# Patient Record
Sex: Male | Born: 1970 | Hispanic: Yes | Marital: Married | Smoking: Never smoker
Health system: Southern US, Community
[De-identification: ages and names within clinical notes are randomized; demographics above are authoritative.]

## PROBLEM LIST (undated history)

## (undated) DIAGNOSIS — R0600 Dyspnea, unspecified: Secondary | ICD-10-CM

## (undated) DIAGNOSIS — I10 Essential (primary) hypertension: Secondary | ICD-10-CM

## (undated) DIAGNOSIS — R7303 Prediabetes: Secondary | ICD-10-CM

---

## 2009-04-21 ENCOUNTER — Emergency Department: Payer: Self-pay | Admitting: Emergency Medicine

## 2018-11-24 ENCOUNTER — Other Ambulatory Visit: Payer: Self-pay

## 2018-11-24 ENCOUNTER — Telehealth: Payer: Self-pay

## 2018-11-24 DIAGNOSIS — Z20822 Contact with and (suspected) exposure to covid-19: Secondary | ICD-10-CM

## 2018-11-24 NOTE — Telephone Encounter (Signed)
Dr. Rutherford Guys from [facility] called to request the patient for covid testing. I called the patient using [facility] ID# [id number] and advised of the request, he verbalized understanding. Appointment scheduled for today at 1245 at [facility], advised of location and to wear a mask for everyone in the vehicle, he verbalized understanding. Order placed.   [place] Fax: [phone]

## 2018-12-01 LAB — NOVEL CORONAVIRUS, NAA: SARS-CoV-2, NAA: DETECTED — AB

## 2019-12-31 ENCOUNTER — Emergency Department
Admission: EM | Admit: 2019-12-31 | Discharge: 2019-12-31 | Disposition: A | Discharge status: Home / Self Care | Payer: No Typology Code available for payment source | Attending: Emergency Medicine | Admitting: Emergency Medicine

## 2019-12-31 ENCOUNTER — Emergency Department: Payer: No Typology Code available for payment source

## 2019-12-31 ENCOUNTER — Other Ambulatory Visit: Payer: Self-pay

## 2019-12-31 DIAGNOSIS — M542 Cervicalgia: Secondary | ICD-10-CM | POA: Diagnosis not present

## 2019-12-31 DIAGNOSIS — Y9389 Activity, other specified: Secondary | ICD-10-CM | POA: Insufficient documentation

## 2019-12-31 DIAGNOSIS — Y999 Unspecified external cause status: Secondary | ICD-10-CM | POA: Diagnosis not present

## 2019-12-31 DIAGNOSIS — M545 Low back pain: Secondary | ICD-10-CM | POA: Insufficient documentation

## 2019-12-31 DIAGNOSIS — Y9289 Other specified places as the place of occurrence of the external cause: Secondary | ICD-10-CM | POA: Diagnosis not present

## 2019-12-31 DIAGNOSIS — M546 Pain in thoracic spine: Secondary | ICD-10-CM | POA: Diagnosis not present

## 2019-12-31 DIAGNOSIS — I10 Essential (primary) hypertension: Secondary | ICD-10-CM | POA: Insufficient documentation

## 2019-12-31 HISTORY — DX: Essential (primary) hypertension: I10

## 2019-12-31 MED ORDER — MELOXICAM 15 MG PO TABS: 15.0000 mg | ORAL_TABLET | Freq: Every day | ORAL | 1 refills | Status: AC

## 2019-12-31 MED ORDER — METHOCARBAMOL 500 MG PO TABS: 500.0000 mg | ORAL_TABLET | Freq: Three times a day (TID) | ORAL | 0 refills | Status: AC | PRN
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
## 2019-12-31 NOTE — ED Notes (Signed)
DC instructions discussed via live spanish interpreter.
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
## 2019-12-31 NOTE — Discharge Instructions (Signed)
Take meloxicam and Robaxin for pain and inflammation. °

## 2020-08-23 ENCOUNTER — Encounter: Payer: Self-pay | Admitting: Ophthalmology

## 2020-08-27 ENCOUNTER — Other Ambulatory Visit
Admission: RE | Admit: 2020-08-27 | Discharge: 2020-08-27 | Disposition: A | Discharge status: Home / Self Care | Payer: Self-pay | Source: Ambulatory Visit | Attending: Ophthalmology | Admitting: Ophthalmology

## 2020-08-27 ENCOUNTER — Other Ambulatory Visit: Payer: Self-pay

## 2020-08-27 DIAGNOSIS — Z20822 Contact with and (suspected) exposure to covid-19: Secondary | ICD-10-CM | POA: Insufficient documentation

## 2020-08-27 DIAGNOSIS — Z01812 Encounter for preprocedural laboratory examination: Secondary | ICD-10-CM | POA: Insufficient documentation

## 2020-08-28 LAB — SARS CORONAVIRUS 2 (TAT 6-24 HRS): SARS Coronavirus 2: NEGATIVE

## 2020-09-03 ENCOUNTER — Other Ambulatory Visit: Payer: Self-pay

## 2020-09-03 ENCOUNTER — Other Ambulatory Visit
Admission: RE | Admit: 2020-09-03 | Discharge: 2020-09-03 | Disposition: A | Discharge status: Home / Self Care | Payer: Self-pay | Source: Ambulatory Visit | Attending: Ophthalmology | Admitting: Ophthalmology

## 2020-09-03 DIAGNOSIS — Z20822 Contact with and (suspected) exposure to covid-19: Secondary | ICD-10-CM | POA: Insufficient documentation

## 2020-09-03 DIAGNOSIS — Z01812 Encounter for preprocedural laboratory examination: Secondary | ICD-10-CM | POA: Insufficient documentation

## 2020-09-03 LAB — SARS CORONAVIRUS 2 (TAT 6-24 HRS): SARS Coronavirus 2: NEGATIVE

## 2020-09-05 ENCOUNTER — Encounter
Admission: RE | Disposition: A | Discharge status: Home / Self Care | Payer: Self-pay | Source: Home / Self Care | Attending: Ophthalmology

## 2020-09-05 ENCOUNTER — Other Ambulatory Visit: Payer: Self-pay

## 2020-09-05 ENCOUNTER — Ambulatory Visit: Payer: Self-pay | Admitting: Anesthesiology

## 2020-09-05 ENCOUNTER — Encounter: Payer: Self-pay | Admitting: Ophthalmology

## 2020-09-05 ENCOUNTER — Ambulatory Visit
Admission: RE | Admit: 2020-09-05 | Discharge: 2020-09-05 | Disposition: A | Discharge status: Home / Self Care | Payer: Self-pay | Attending: Ophthalmology | Admitting: Ophthalmology

## 2020-09-05 DIAGNOSIS — Z79899 Other long term (current) drug therapy: Secondary | ICD-10-CM | POA: Insufficient documentation

## 2020-09-05 DIAGNOSIS — H26102 Unspecified traumatic cataract, left eye: Secondary | ICD-10-CM | POA: Insufficient documentation

## 2020-09-05 HISTORY — PX: CATARACT EXTRACTION W/PHACO: SHX586

## 2020-09-05 HISTORY — DX: Dyspnea, unspecified: R06.00

## 2020-09-05 HISTORY — DX: Prediabetes: R73.03

## 2020-09-05 SURGERY — PHACOEMULSIFICATION, CATARACT, WITH IOL INSERTION
Anesthesia: Monitor Anesthesia Care | Site: Eye | Laterality: Left

## 2020-09-05 MED ORDER — BRIMONIDINE TARTRATE-TIMOLOL 0.2-0.5 % OP SOLN
OPHTHALMIC | Status: DC | PRN
  Administered 2020-09-05: 1 [drp] via OPHTHALMIC

## 2020-09-05 MED ORDER — SODIUM HYALURONATE 23 MG/ML IO SOLN
INTRAOCULAR | Status: DC | PRN
  Administered 2020-09-05: 0.6 mL via INTRAOCULAR

## 2020-09-05 MED ORDER — ACETAMINOPHEN 325 MG PO TABS
325.0000 mg | ORAL_TABLET | ORAL | Status: DC | PRN
Start: 2020-09-05 — End: 2020-09-05

## 2020-09-05 MED ORDER — ARMC OPHTHALMIC DILATING DROPS
1.0000 "application " | OPHTHALMIC | Status: DC | PRN
  Administered 2020-09-05 (×3): 1 via OPHTHALMIC

## 2020-09-05 MED ORDER — ALFENTANIL 500 MCG/ML IJ INJ
INJECTION | INTRAVENOUS | Status: DC | PRN
  Administered 2020-09-05: 500 ug via INTRAVENOUS

## 2020-09-05 MED ORDER — MIDAZOLAM HCL 2 MG/2ML IJ SOLN
INTRAMUSCULAR | Status: DC | PRN
  Administered 2020-09-05: 1 mg via INTRAVENOUS

## 2020-09-05 MED ORDER — LIDOCAINE HCL (PF) 2 % IJ SOLN
INTRAOCULAR | Status: DC | PRN
  Administered 2020-09-05: .5 mL

## 2020-09-05 MED ORDER — LIDOCAINE HCL 2 % IJ SOLN
INTRAMUSCULAR | Status: DC | PRN
  Administered 2020-09-05: 3 mL via OPHTHALMIC

## 2020-09-05 MED ORDER — EPINEPHRINE PF 1 MG/ML IJ SOLN
INTRAOCULAR | Status: DC | PRN
  Administered 2020-09-05: 96 mL via OPHTHALMIC

## 2020-09-05 MED ORDER — TETRACAINE HCL 0.5 % OP SOLN
1.0000 [drp] | OPHTHALMIC | Status: DC | PRN
  Administered 2020-09-05 (×3): 1 [drp] via OPHTHALMIC

## 2020-09-05 MED ORDER — ACETAMINOPHEN 160 MG/5ML PO SOLN: 325.0000 mg | ORAL | Status: DC | PRN

## 2020-09-05 MED ORDER — NA HYALUR & NA CHOND-NA HYALUR 0.4-0.35 ML IO KIT
PACK | INTRAOCULAR | Status: DC | PRN
  Administered 2020-09-05: .5 mL via INTRAOCULAR

## 2020-09-05 MED ORDER — ONDANSETRON HCL 4 MG/2ML IJ SOLN: 4.0000 mg | Freq: Once | INTRAMUSCULAR | Status: DC | PRN

## 2020-09-05 MED ORDER — CEFUROXIME OPHTHALMIC INJECTION 1 MG/0.1 ML
INJECTION | OPHTHALMIC | Status: DC | PRN
  Administered 2020-09-05: 0.1 mL via INTRACAMERAL

## 2020-09-05 MED ORDER — TRYPAN BLUE 0.06 % OP SOLN
OPHTHALMIC | Status: DC | PRN
  Administered 2020-09-05: 0.5 mL via INTRAOCULAR

## 2020-09-05 SURGICAL SUPPLY — 23 items
CANNULA ANT/CHMB 27GA (MISCELLANEOUS) ×2 IMPLANT
GLOVE EXAM LX STRL 7.5 (GLOVE) ×2 IMPLANT
GLOVE SURG TRIUMPH 8.0 PF LTX (GLOVE) ×2 IMPLANT
GOWN STRL REUS W/ TWL LRG LVL3 (GOWN DISPOSABLE) ×2 IMPLANT
GOWN STRL REUS W/TWL LRG LVL3 (GOWN DISPOSABLE) ×4
LENS IOL DIOP 15.5 (Intraocular Lens) ×2 IMPLANT
LENS IOL TECNIS MONO 15.5 (Intraocular Lens) ×1 IMPLANT
MARKER SKIN DUAL TIP RULER LAB (MISCELLANEOUS) ×2 IMPLANT
NDL RETROBULBAR .5 NSTRL (NEEDLE) ×2 IMPLANT
NDL SAFETY ECLIPSE 18X1.5 (NEEDLE) ×1 IMPLANT
NEEDLE CAPSULORHEX 25GA (NEEDLE) ×2 IMPLANT
NEEDLE FILTER BLUNT 18X 1/2SAF (NEEDLE) ×2
NEEDLE FILTER BLUNT 18X1 1/2 (NEEDLE) ×2 IMPLANT
NEEDLE HYPO 18GX1.5 SHARP (NEEDLE) ×2
PACK CATARACT BRASINGTON (MISCELLANEOUS) ×2 IMPLANT
PACK EYE AFTER SURG (MISCELLANEOUS) ×2 IMPLANT
PACK OPTHALMIC (MISCELLANEOUS) ×2 IMPLANT
SOLUTION OPHTHALMIC SALT (MISCELLANEOUS) ×2 IMPLANT
SYR 3ML LL SCALE MARK (SYRINGE) ×4 IMPLANT
SYR 5ML LL (SYRINGE) ×2 IMPLANT
SYR TB 1ML LUER SLIP (SYRINGE) ×2 IMPLANT
WATER STERILE IRR 250ML POUR (IV SOLUTION) ×2 IMPLANT
WIPE NON LINTING 3.25X3.25 (MISCELLANEOUS) ×2 IMPLANT

## 2020-09-05 NOTE — Discharge Instructions (Signed)
Cirugía de cataratas, cuidados posteriores °Cataract Surgery, Care After °Esta hoja le brinda información sobre cómo cuidarse después del procedimiento. El médico también podrá darle indicaciones más específicas. Comuníquese con el médico si tiene problemas o preguntas. °¿Qué puedo esperar después del procedimiento? °Después del procedimiento, es común tener los siguientes síntomas: °· Picazón. °· Molestias. °· Secreción líquida. °· Sensibilidad a la luz y al tacto. °· Moretones en el ojo o a su alrededor. °· Leve visión borrosa. °Sigue estas indicaciones en tu casa: °Cuidado de los ojos °· No se toque ni se frote los ojos. °· Protéjase los ojos como se lo haya indicado el médico. Tal vez le indiquen que use una protección ocular resistente o gafas de sol. °· No use lentes de contacto en el ojo o los ojos afectados hasta que el médico lo autorice. °· Mantenga la zona que rodea el ojo limpia y seca: °? Evite nadar. °? No permita que el agua le caiga directamente en el rostro cuando se ducha. °? Evite que el jabón o el champú entren en sus ojos. °· Revísese el ojo todos los días para detectar signos de infección. Esté atento a lo siguiente: °? Enrojecimiento, hinchazón o dolor. °? Líquido, sangre o pus. °? Calor. °? Mal olor. °? Visión que empeora. °? Sensibilidad que empeora.   °Actividad °· No conduzca durante 24 horas si le administraron un sedante durante el procedimiento. °· Evite las actividades extenuantes, como practicar deportes de contacto, durante el tiempo que le haya indicado el médico. °· No conduzca ni opere maquinaria pesada hasta que el médico lo autorice. °· No se incline ni levante objetos pesados. Al agacharse, aumenta la presión en los ojos. Puede caminar, subir escaleras y realizar trabajos domésticos livianos. °· Pregunte a su médico cuándo podrá volver al trabajo. Si trabaja en un ambiente con polvo, podrán indicarle que use una protección ocular durante cierto tiempo. °Indicaciones  generales °· Tome o aplíquese los medicamentos de venta libre y los recetados solamente como se lo haya indicado el médico. Esto incluye las gotas oftálmicas. °· Concurra a todas las visitas de seguimiento como se lo haya indicado el médico. Esto es importante. °Comuníquese con un médico si: °· Observa un aumento del hematoma alrededor del ojo. °· Siente dolor que no se alivia con los medicamentos. °· Fiebre. °· Presenta enrojecimiento, hinchazón o dolor en el ojo. °· Observa líquido, sangre o pus que emanan de la incisión. °· La visión empeora. °· La sensibilidad a la luz empeora. °Solicite ayuda inmediatamente si: °· Pierde la visión repentinamente. °· Ve destellos de luz o manchas (moscas volantes). °· Siente dolor intenso en el ojo. °· Siente náuseas o vómitos. °Resumen °· Después del procedimiento, es habitual tener picazón, molestias, moretones, secreción de líquido o sensibilidad a la luz. °· Siga las instrucciones del médico acerca de los cuidados para el ojo después del procedimiento. °· No se frote el ojo después del procedimiento. Puede ser necesario que use protección para los ojos o gafas de sol. No use lentes de contacto. Mantenga el área que rodea el ojo limpia y seca. °· Evite actividades que requieran mucho esfuerzo. Estas incluyen practicar deportes y levantar objetos pesados. °· Comuníquese con un médico si aumentan los moretones, el dolor no desaparece o tiene fiebre. Solicite ayuda de inmediato si de repente pierde la visión, ve destellos de luz o manchas o tiene un dolor intenso en el ojo. °Esta información no tiene como fin reemplazar el consejo del médico. Asegúrese de hacerle al médico   cualquier pregunta que tenga. °Document Revised: 12/29/2017 Document Reviewed: 12/29/2017 °Elsevier Patient Education © 2021 Elsevier Inc. ° ° °Anestesia general en adultos, cuidados posteriores °General Anesthesia, Adult, Care After °Esta hoja le brinda información sobre cómo cuidarse después del  procedimiento. El médico también podrá darle instrucciones más específicas. Comuníquese con el médico si tiene problemas o preguntas. °¿Qué puedo esperar después del procedimiento? °Luego del procedimiento, son comunes los siguiente efectos secundarios: °· Dolor o molestias en el lugar de la vía intravenosa (i.v.). °· Náuseas. °· Vómitos. °· El dolor de garganta. °· Dificultad para concentrarte. °· Sentir frío o tener escalofríos. °· Sensación de debilidad o cansancio. °· Somnolencia y fatiga. °· Malestar y dolor corporal. Estos efectos secundarios pueden afectar partes del cuerpo que no estuvieron involucradas en la cirugía. °Siga estas instrucciones en su casa: °Durante el período de tiempo que le haya indicado el médico: °· Descanse. °· No participe en actividades que impliquen posibles caídas o lesiones. °· No conduzca ni opere maquinaria. °· No beba alcohol. °· No tome somníferos ni medicamentos que causen somnolencia. °· No tome decisiones trascendentes ni firme documentos importantes. °· No cuide a niños por su cuenta.   °Comida y bebida °· Siga las indicaciones del médico respecto de las restricciones de comidas o bebidas. °· Cuando tenga hambre, comience a comer cantidades pequeñas de alimentos que sean blandos y fáciles de digerir (livianos), como una tostada. Retome su dieta habitual de forma gradual. °· Beber suficiente líquido como para mantener la orina de color amarillo pálido. °· Si vomita, rehidrátese tomando agua, jugo o caldo transparente. °Instrucciones generales °· Si tiene apnea del sueño, la cirugía y ciertos medicamentos pueden elevar su riesgo de tener problemas respiratorios. Siga las instrucciones del médico respecto al uso del dispositivo para dormir: °? Siempre que duerma, incluso durante las siestas que tome en el día. °? Mientras tome analgésicos recetados, medicamentos para dormir o medicamentos que producen somnolencia. °· Pida a un adulto responsable que se quede con usted durante  el tiempo que se le indique. Es importante tener a alguien que lo ayude a cuidarse hasta que esté despierto y consciente. °· Retome sus actividades normales según lo indicado por el médico. Pregúntele al médico qué actividades son seguras para usted. °· Use los medicamentos de venta libre y los recetados solamente como se lo haya indicado el médico. °· Si fuma, no lo haga sin supervisión. °· Concurra a todas las visitas de seguimiento como se lo haya indicado el médico. Esto es importante. °Comuníquese con un médico si: °· Tiene náuseas o vómitos que no mejoran con medicamentos. °· No puede comer ni beber sin vomitar. °· Su dolor no se alivia con medicamentos. °· No puede orinar. °· Tiene una erupción cutánea. °· Tiene fiebre. °· Presenta enrojecimiento alrededor del lugar de la vía intravenosa (i.v.) que empeora. °Solicite ayuda de inmediato si: °· Tienes dificultad para respirar. °· Sientes dolor en el pecho. °· Observa sangre en la orina o heces, o vomita sangre. °Resumen °· Después del procedimiento, es común tener dolor de garganta y náuseas. También es común sentirse cansado. °· Pida a un adulto responsable que se quede con usted durante el tiempo que se le indique. Es importante tener a alguien que lo ayude a cuidarse hasta que esté despierto y consciente. °· Cuando tenga hambre, comience a comer cantidades pequeñas de alimentos que sean blandos y fáciles de digerir (livianos), como una tostada. Retome su dieta habitual de forma gradual. °· Beber suficiente líquido como para   mantener la orina de color amarillo pálido. °· Retome sus actividades normales según lo indicado por el médico. Pregúntele al médico qué actividades son seguras para usted. °Esta información no tiene como fin reemplazar el consejo del médico. Asegúrese de hacerle al médico cualquier pregunta que tenga. °Document Revised: 11/10/2019 Document Reviewed: 11/10/2019 °Elsevier Patient Education © 2021 Elsevier Inc. ° °

## 2020-09-05 NOTE — Anesthesia Postprocedure Evaluation (Signed)
Anesthesia Post Note  Patient: Marcus Dixon  Procedure(s) Performed: CATARACT EXTRACTION PHACO AND INTRAOCULAR LENS PLACEMENT (IOC) LEFT HEALON 5 VISION BLUE (Left Eye)     Patient location during evaluation: PACU Anesthesia Type: MAC Level of consciousness: awake Pain management: pain level controlled Vital Signs Assessment: post-procedure vital signs reviewed and stable Respiratory status: respiratory function stable Cardiovascular status: stable Postop Assessment: no apparent nausea or vomiting Anesthetic complications: no   No complications documented.  Veda Canning

## 2020-09-05 NOTE — Anesthesia Procedure Notes (Signed)
Procedure Name: MAC Date/Time: 09/05/2020 9:19 AM Performed by: Silvana Newness, CRNA Pre-anesthesia Checklist: Patient identified, Emergency Drugs available, Suction available, Patient being monitored and Timeout performed Patient Re-evaluated:Patient Re-evaluated prior to induction Oxygen Delivery Method: Nasal cannula Placement Confirmation: positive ETCO2

## 2020-09-05 NOTE — Anesthesia Preprocedure Evaluation (Signed)
Anesthesia Evaluation  Patient identified by MRN, date of birth, ID band Patient awake    Reviewed: Allergy & Precautions, NPO status   Airway Mallampati: II  TM Distance: >3 FB     Dental   Pulmonary shortness of breath and with exertion,    Pulmonary exam normal        Cardiovascular hypertension,  Rhythm:Regular Rate:Normal     Neuro/Psych    GI/Hepatic   Endo/Other  Prediabetes  Renal/GU      Musculoskeletal   Abdominal   Peds  Hematology   Anesthesia Other Findings   Reproductive/Obstetrics                             Anesthesia Physical Anesthesia Plan  ASA: II  Anesthesia Plan: MAC   Post-op Pain Management:    Induction: Intravenous  PONV Risk Score and Plan: TIVA, Midazolam and Treatment may vary due to age or medical condition  Airway Management Planned: Natural Airway and Nasal Cannula  Additional Equipment:   Intra-op Plan:   Post-operative Plan:   Informed Consent: I have reviewed the patients History and Physical, chart, labs and discussed the procedure including the risks, benefits and alternatives for the proposed anesthesia with the patient or authorized representative who has indicated his/her understanding and acceptance.       Plan Discussed with: CRNA  Anesthesia Plan Comments:         Anesthesia Quick Evaluation

## 2020-09-05 NOTE — Transfer of Care (Signed)
Immediate Anesthesia Transfer of Care Note  Patient: Marcus Dixon  Procedure(s) Performed: CATARACT EXTRACTION PHACO AND INTRAOCULAR LENS PLACEMENT (IOC) LEFT HEALON 5 VISION BLUE (Left Eye)  Patient Location: PACU  Anesthesia Type: MAC  Level of Consciousness: awake, alert  and patient cooperative  Airway and Oxygen Therapy: Patient Spontanous Breathing and Patient connected to supplemental oxygen  Post-op Assessment: Post-op Vital signs reviewed, Patient's Cardiovascular Status Stable, Respiratory Function Stable, Patent Airway and No signs of Nausea or vomiting  Post-op Vital Signs: Reviewed and stable  Complications: No complications documented.

## 2020-09-05 NOTE — H&P (Signed)
  [facility]   Primary Care Physician:  Patient, No Pcp Per (Inactive) Ophthalmologist: Dr. Lockie Mola  Pre-Procedure History & Physical: HPI:  Marcus Dixon is a 50 y.o. male here for ophthalmic surgery.   Past Medical History:  Diagnosis Date  . Dyspnea   . Hypertension   . Pre-diabetes     History reviewed. No pertinent surgical history.  Prior to Admission medications   Medication Sig Start Date End Date Taking? Authorizing Provider  hydrochlorothiazide (MICROZIDE) 12.5 MG capsule Take 12.5 mg by mouth daily.   Yes [provider]  omeprazole (PRILOSEC) 20 MG capsule Take 20 mg by mouth daily.   Yes [provider]    Allergies as of 07/02/2020  . (No Known Allergies)    History reviewed. No pertinent family history.  Social History   Socioeconomic History  . Marital status: Married    Spouse name: Not on file  . Number of children: Not on file  . Years of education: Not on file  . Highest education level: Not on file  Occupational History  . Not on file  Tobacco Use  . Smoking status: Never Smoker  . Smokeless tobacco: Never Used  Substance and Sexual Activity  . Alcohol use: Yes    Comment: weekends  . Drug use: Not Currently  . Sexual activity: Not on file  Other Topics Concern  . Not on file  Social History Narrative  . Not on file   Social Determinants of Health   Financial Resource Strain: Not on file  Food Insecurity: Not on file  Transportation Needs: Not on file  Physical Activity: Not on file  Stress: Not on file  Social Connections: Not on file  Intimate Partner Violence: Not on file    Review of Systems: See HPI, otherwise negative ROS  Physical Exam: BP (!) 144/96   Pulse 81   Temp (!) 97.2 F (36.2 C) (Temporal)   Resp 18   Ht 5\' 9"  (1.753 m)   Wt 90.7 kg   SpO2 96%   BMI 29.53 kg/m  General:   Alert,  pleasant and cooperative in NAD Head:  Normocephalic and  atraumatic. Lungs:  Clear to auscultation.    Heart:  Regular rate and rhythm.   Impression/Plan: Marcus Dixon is here for ophthalmic surgery.  Risks, benefits, limitations, and alternatives regarding ophthalmic surgery have been reviewed with the patient.  Questions have been answered.  All parties agreeable.   Jodi Marble, MD  09/05/2020, 8:46 AM

## 2020-09-05 NOTE — Op Note (Signed)
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  Vision Blue dye was used to stain the lens capsule.  LENS:   Implant Name Type Inv. Item Serial No. Manufacturer Lot No. LRB No. Used Action  LENS IOL DIOP 15.5 - [id number] Intraocular Lens LENS IOL DIOP 15.5 [id number] JOHNSON   Left 1 Implanted       ULTRASOUND TIME: 16% of 2 minutes 27 seconds, CDE 23.5  SURGEON:  Deirdre Evener, MD   ANESTHESIA:  Retrobulbar block of Xylocaine and Bupivacaine and Hyaluronidase   COMPLICATIONS:  None.   DESCRIPTION OF PROCEDURE:  The patient was identified in the holding room and transported to the operating suite and placed in the supine position. The left eye was identified as the operative eye, and a retrobulbar block was administered under inravenous sedation.  It was then prepped and draped in the usual sterile ophthalmic fashion.   A 1 millimeter clear-corneal paracentesis was made at the 1:30 position.  0.5 ml of preservative-free 1% lidocaine was injected into the anterior chamber. The anterior chamber was filled with Healon 5 viscoelastic.  A 2.4 millimeter keratome was used to make a near-clear corneal incision at the 10:30 position.  The anterior chamber was filled with Healon 5 viscoelastic.  Vision Blue dye was then injected under the viscoelastic to stain the lens capsule.  BSS was then used to wash the dye out.  Additional Healon 5 was placed into the anterior chamber.  A curvilinear capsulorrhexis was made with a cystotome and capsulorrhexis forceps. The capsule and lens cortex were fibrotic and the rhexis was made in several steps with coprtex  attached.  Balanced salt solution was used to hydrodissect and hydrodelineate the nucleus.  Viscoat was then placed in the anterior chamber.   Phacoemulsification was then used in stop and chop fashion to remove the lens nucleus and epinucleus.  The remaining cortex was then removed using the irrigation and aspiration handpiece. Subincisional cortex was left until after lens placement because it was fibrotic and adherent to the anterior capsule. Provisc was then placed into the capsular bag to distend it for lens placement.  A 15.5 -diopter lens was then injected into the capsular bag.  The remaining viscoelastic was aspirated. Capsulorhexis forceps were used to gently tease the subincisional cortex free and remove it with the capsule intact.   Wounds were hydrated with balanced salt solution.  The anterior chamber was inflated to a physiologic pressure with balanced salt solution. Cefuroxime 0.1 ml of a 10mg /ml solution was injected into the anterior chamber for a dose of 1 mg of intracameral antibiotic at the completion of the case. Miostat was placed into the anterior chamber to constrict the pupil.  No wound leaks were noted.  Topical Vigamox drops and Maxitrol ointment were applied to the eye.  The patient was taken to the recovery room in stable condition without complications of anesthesia or surgery.  [name] 09/05/2020, 9:50 AM

## 2020-09-06 ENCOUNTER — Encounter: Payer: Self-pay | Admitting: Ophthalmology
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
